# Patient Record
Sex: Male | Born: 1960 | Race: Black or African American | Hispanic: No | Marital: Single | Smoking: Never smoker
Health system: Southern US, Community
[De-identification: ages and names within clinical notes are randomized; demographics above are authoritative.]

## PROBLEM LIST (undated history)

## (undated) HISTORY — PX: KNEE SURGERY: SHX244

---

## 2006-09-29 ENCOUNTER — Emergency Department: Payer: Self-pay | Admitting: Emergency Medicine

## 2006-09-30 ENCOUNTER — Emergency Department: Payer: Self-pay | Admitting: Emergency Medicine

## 2009-03-24 ENCOUNTER — Ambulatory Visit: Payer: Self-pay | Admitting: Internal Medicine

## 2014-01-22 DIAGNOSIS — E785 Hyperlipidemia, unspecified: Secondary | ICD-10-CM | POA: Insufficient documentation

## 2017-07-20 ENCOUNTER — Ambulatory Visit (INDEPENDENT_AMBULATORY_CARE_PROVIDER_SITE_OTHER)
Admission: EM | Admit: 2017-07-20 | Discharge: 2017-07-20 | Disposition: A | Discharge status: Other Acute Inpatient Hospital | Payer: Commercial Managed Care - HMO | Source: Home / Self Care | Attending: Emergency Medicine | Admitting: Emergency Medicine

## 2017-07-20 ENCOUNTER — Encounter: Payer: Self-pay | Admitting: Emergency Medicine

## 2017-07-20 ENCOUNTER — Emergency Department: Payer: Commercial Managed Care - HMO

## 2017-07-20 ENCOUNTER — Emergency Department
Admission: EM | Admit: 2017-07-20 | Discharge: 2017-07-20 | Disposition: A | Discharge status: Home / Self Care | Payer: Commercial Managed Care - HMO | Attending: Emergency Medicine | Admitting: Emergency Medicine

## 2017-07-20 ENCOUNTER — Other Ambulatory Visit: Payer: Self-pay

## 2017-07-20 DIAGNOSIS — R1013 Epigastric pain: Secondary | ICD-10-CM | POA: Diagnosis not present

## 2017-07-20 DIAGNOSIS — R1110 Vomiting, unspecified: Secondary | ICD-10-CM | POA: Insufficient documentation

## 2017-07-20 DIAGNOSIS — D135 Benign neoplasm of extrahepatic bile ducts: Secondary | ICD-10-CM | POA: Diagnosis not present

## 2017-07-20 DIAGNOSIS — K819 Cholecystitis, unspecified: Secondary | ICD-10-CM | POA: Insufficient documentation

## 2017-07-20 LAB — URINALYSIS, COMPLETE (UACMP) WITH MICROSCOPIC
Bacteria, UA: NONE SEEN
Bilirubin Urine: NEGATIVE
GLUCOSE, UA: NEGATIVE mg/dL
Hgb urine dipstick: NEGATIVE
KETONES UR: NEGATIVE mg/dL
Nitrite: NEGATIVE
PH: 6 (ref 5.0–8.0)
PROTEIN: NEGATIVE mg/dL
Specific Gravity, Urine: 1.011 (ref 1.005–1.030)

## 2017-07-20 LAB — COMPREHENSIVE METABOLIC PANEL
ALBUMIN: 4 g/dL (ref 3.5–5.0)
ALK PHOS: 106 U/L (ref 38–126)
ALT: 41 U/L (ref 17–63)
AST: 55 U/L — AB (ref 15–41)
Anion gap: 7 (ref 5–15)
BUN: 13 mg/dL (ref 6–20)
CALCIUM: 8.7 mg/dL — AB (ref 8.9–10.3)
CHLORIDE: 105 mmol/L (ref 101–111)
CO2: 25 mmol/L (ref 22–32)
CREATININE: 0.9 mg/dL (ref 0.61–1.24)
GFR calc Af Amer: >60 mL/min (ref >60)
GFR calc non Af Amer: >60 mL/min (ref >60)
GLUCOSE: 97 mg/dL (ref 65–99)
Potassium: 4.6 mmol/L (ref 3.5–5.1)
SODIUM: 137 mmol/L (ref 135–145)
Total Bilirubin: 0.9 mg/dL (ref 0.3–1.2)
Total Protein: 6.7 g/dL (ref 6.5–8.1)

## 2017-07-20 LAB — CBC
HCT: 40.3 % (ref 40.0–52.0)
Hemoglobin: 13.3 g/dL (ref 13.0–18.0)
MCH: 31.1 pg (ref 26.0–34.0)
MCHC: 33 g/dL (ref 32.0–36.0)
MCV: 94.3 fL (ref 80.0–100.0)
PLATELETS: 216 10*3/uL (ref 150–440)
RBC: 4.27 MIL/uL — AB (ref 4.40–5.90)
RDW: 13.4 % (ref 11.5–14.5)
WBC: 5.5 10*3/uL (ref 3.8–10.6)

## 2017-07-20 LAB — LIPASE, BLOOD: LIPASE: 29 U/L (ref 11–51)

## 2017-07-20 LAB — TROPONIN I: Troponin I: <0.03 ng/mL (ref <0.03)

## 2017-07-20 MED ORDER — OXYCODONE-ACETAMINOPHEN 5-325 MG PO TABS
1.0000 | ORAL_TABLET | Freq: Once | ORAL | Status: AC
  Administered 2017-07-20: 1 via ORAL
  Filled 2017-07-20: qty 1

## 2017-07-20 MED ORDER — IOPAMIDOL (ISOVUE-300) INJECTION 61%
30.0000 mL | Freq: Once | INTRAVENOUS | Status: AC | PRN
  Administered 2017-07-20: 30 mL via ORAL

## 2017-07-20 MED ORDER — TRAMADOL HCL 50 MG PO TABS: 50.0000 mg | ORAL_TABLET | Freq: Four times a day (QID) | ORAL | 0 refills | Status: AC | PRN

## 2017-07-20 MED ORDER — IOPAMIDOL (ISOVUE-300) INJECTION 61%
100.0000 mL | Freq: Once | INTRAVENOUS | Status: AC | PRN
  Administered 2017-07-20: 100 mL via INTRAVENOUS

## 2017-07-20 MED ORDER — NAPROXEN 375 MG PO TABS: 375.0000 mg | ORAL_TABLET | Freq: Two times a day (BID) | ORAL | 0 refills | Status: AC

## 2017-07-20 MED ORDER — PROMETHAZINE HCL 12.5 MG PO TABS: 12.5000 mg | ORAL_TABLET | Freq: Four times a day (QID) | ORAL | 0 refills | Status: AC | PRN

## 2017-07-20 NOTE — ED Notes (Signed)
Patient transported to CT 

## 2017-07-20 NOTE — ED Provider Notes (Signed)
HPI  SUBJECTIVE:  Gerald Hicks is a 57 y.o. male who presents with hours long now has become constant non-migratory epigastric pain starting at 0300 this morning.  Patient states it radiates to his back.  He denies chest pain, shortness of breath, fevers, palpitations, chest pressure or heaviness.  No abdominal distention.  No dysuria, urinary urgency, frequency, cloudy odorous urine.  Patient does report penile discharge.  Denies testicular pain or swelling.  No alcohol use.  He tried drinking milk, water, vinegar which he promptly vomited up.  He states that this made the pain better.  He states the pain is worse with movement.  Brothers reports that the patient is not passing gas this morning.  He had a normal bowel movement yesterday.  Denies melena, hematochezia.  States the car ride over here was not painful.  Last meal was more than 6-8 hours ago, no history of pancreatitis, diabetes, hypertension, atrial fibrillation, gallbladder disease, mesenteric ischemia or abdominal surgeries.  Brother states that the patient has had similar pain before when the patient was found to be constipated.   History reviewed. No pertinent past medical history.  Past Surgical History:  Procedure Laterality Date  . KNEE SURGERY      Family History  Problem Relation Age of Onset  . Diabetes Mother   . Diabetes Father     Social History   Tobacco Use  . Smoking status: Never Smoker  . Smokeless tobacco: Never Used  Substance Use Topics  . Alcohol use: No    Frequency: Never  . Drug use: Not on file    No current facility-administered medications for this encounter.  No current outpatient medications on file.  No Known Allergies   ROS  As noted in HPI.   Physical Exam  BP 124/80 (BP Location: Left Arm)   Pulse 94   Temp 97.7 F (36.5 C)   Resp 18   Ht 5\' 4"  (1.626 m)   Wt 160 lb (72.6 kg)   SpO2 98%   BMI 27.46 kg/m   Constitutional: Well developed, well nourished, crying,   writhing around on the stretcher.  Diaphoretic. Eyes:  EOMI, conjunctiva normal bilaterally HENT: Normocephalic, atraumatic,mucus membranes moist Respiratory: Normal inspiratory effort Cardiovascular: Normal rate regular rhythm no murmurs or rubs, gallops GI: Limited exam due to patient discomfort.  Rigid abdomen. Normal appearance.  Positive subxiphoid tenderness.  No periumbilical tenderness.  No right lower quadrant, right upper quadrant tenderness.  Positive guarding.  Hypoactive bowel sounds. skin: No rash, skin intact Musculoskeletal: no deformities Neurologic: Alert & oriented x 3, no focal neuro deficits Psychiatric: Speech and behavior appropriate   ED Course   Medications - No data to display  Orders Placed This Encounter  Procedures  . ED EKG    Upper abd pain    Standing Status:   Standing    Number of Occurrences:   1    Order Specific Question:   Reason for Exam    Answer:   Other (See Comments)  . EKG 12-Lead    Standing Status:   Standing    Number of Occurrences:   1    No results found for this or any previous visit (from the past 24 hour(s)). No results found.  ED Clinical Impression  Epigastric pain   ED Assessment/Plan  Care everywhere records reviewed.  No mention of abdominal pain.  EKG: Normal sinus rhythm, rate 92.  Normal axis, normal intervals.  T wave inversion in 3, no  other ST-T wave changes.  T wave abnormality in lead III present in an EKG from 2010   due to patient's amount of pain, transferring to the ED via EMS for a comprehensive workup and pain control.  In the differential is perforated viscus, perforated ulcer, mesenteric ischemia, pancreatitis, aortic abdominal aneurysm, acute obstruction.   Discussed rationale for transfer with patient and brother.  They agree with plan.  Will notify the ED.  No orders of the defined types were placed in this encounter.   *This clinic note was created using Dragon dictation software.  Therefore, there may be occasional mistakes despite careful proofreading.   ?   Melynda Ripple, MD 07/20/17 1102

## 2017-07-20 NOTE — ED Provider Notes (Signed)
Patient received in sign-out from Dr. Cherylann Banas.  Workup and evaluation pending repeat abdominal exam monitoring.  Repeat abdominal exam soft and benign.  Patient's pain is controlled.  Remains afebrile.  At this point patient is appropriate for outpatient follow-up with GI.  Discussed strict return precautions.      Merlyn Lot, MD 07/20/17 [phone]

## 2017-07-20 NOTE — Discharge Instructions (Signed)

## 2017-07-20 NOTE — ED Triage Notes (Signed)
Patient states he woke at 3am with severe abdominal pain. Patient states he has had this issue before.

## 2017-07-20 NOTE — ED Triage Notes (Signed)
Pt to ED via EMS from [facility] c/o abd pain, epigastric, that started this morning around 3am.  States some nausea but no vomiting or diarrhea, last BM yesterday.  EMS gave 59mcg fentanyl, 4mg  zofran, and 262mL normal saline.

## 2017-07-20 NOTE — ED Provider Notes (Signed)
[facility] Emergency Department Provider Note ____________________________________________   First MD Initiated Contact with Patient 07/20/17 1202     (approximate)  I have reviewed the triage vital signs and the nursing notes.   HISTORY  Chief Complaint Abdominal Pain    HPI Gerald Hicks is a 57 y.o. male with no significant past medical history who presents with epigastric abdominal pain, acute onset approximately 9 hours ago during the night, episodic, with severe cramps lasting a few minutes and then resolving.  Patient reports one associated episode of vomiting although he states this was somewhat self-induced, and he last had a bowel movement yesterday which was normal.  He denies any unusual foods or recent antibiotics.  He denies any alcohol or drug use.  No prior history of this pain.   History reviewed. No pertinent past medical history.  There are no active problems to display for this patient.   Past Surgical History:  Procedure Laterality Date  . KNEE SURGERY      Prior to Admission medications   Not on File    Allergies Patient has no known allergies.  Family History  Problem Relation Age of Onset  . Diabetes Mother   . Diabetes Father     Social History Social History   Tobacco Use  . Smoking status: Never Smoker  . Smokeless tobacco: Never Used  Substance Use Topics  . Alcohol use: No    Frequency: Never  . Drug use: No    Review of Systems  Constitutional: No fever/chills. Eyes: No redness. ENT: No sore throat. Cardiovascular: Denies chest pain. Respiratory: Denies shortness of breath. Gastrointestinal: Positive for abdominal pain.  Genitourinary: Negative for dysuria.  Musculoskeletal: Negative for back pain. Skin: Negative for rash. Neurological: Negative for headache.   ____________________________________________   PHYSICAL EXAM:  VITAL SIGNS: ED Triage Vitals [07/20/17 1138]  Enc Vitals  Group     BP 129/77     Pulse Rate 85     Resp 18     Temp 97.8 F (36.6 C)     Temp Source Oral     SpO2 98 %     Weight 160 lb (72.6 kg)     Height 5\' 4"  (1.626 m)     Head Circumference      Peak Flow      Pain Score 5     Pain Loc      Pain Edu?      Excl. in [place]?     Constitutional: Alert and oriented. Well appearing and in no acute distress. Eyes: Conjunctivae are normal.  Head: Atraumatic. Nose: No congestion/rhinnorhea. Mouth/Throat: Mucous membranes are moist.   Neck: Normal range of motion.  Cardiovascular: Normal rate, regular rhythm. Grossly normal heart sounds.  Good peripheral circulation. Respiratory: Normal respiratory effort.  No retractions. Lungs CTAB. Gastrointestinal: Soft with mild epigastric discomfort. No distention.  Genitourinary: No CVA tenderness. Musculoskeletal: No lower extremity edema.  Extremities warm and well perfused.  Neurologic:  Normal speech and language. No gross focal neurologic deficits are appreciated.  Skin:  Skin is warm and dry. No rash noted. Psychiatric: Mood and affect are normal. Speech and behavior are normal.  ____________________________________________   LABS (all labs ordered are listed, but only abnormal results are displayed)  Labs Reviewed  COMPREHENSIVE METABOLIC PANEL - Abnormal; Notable for the following components:      Result Value   Calcium 8.7 (*)    AST 55 (*)    All  other components within normal limits  CBC - Abnormal; Notable for the following components:   RBC 4.27 (*)    All other components within normal limits  URINALYSIS, COMPLETE (UACMP) WITH MICROSCOPIC - Abnormal; Notable for the following components:   Color, Urine YELLOW (*)    APPearance CLEAR (*)    Leukocytes, UA SMALL (*)    Squamous Epithelial / LPF 0-5 (*)    All other components within normal limits  LIPASE, BLOOD  TROPONIN I   ____________________________________________  EKG  ED ECG REPORT I, Arta Silence, the  attending physician, personally viewed and interpreted this ECG.  Date: 07/20/2017 EKG Time: 1143 Rate: 69 Rhythm: normal sinus rhythm QRS Axis: normal Intervals: normal ST/T Wave abnormalities: normal Narrative Interpretation: no evidence of acute ischemia  ____________________________________________  RADIOLOGY  CT abd: Gallbladder wall thickening [place] RUQ: Gallbladder wall thickening, no stones or pericholecystic fluid  ____________________________________________   PROCEDURES  Procedure(s) performed: No  Procedures  Critical Care performed: No ____________________________________________   INITIAL IMPRESSION / ASSESSMENT AND PLAN / ED COURSE  Pertinent labs & imaging results that were available during my care of the patient were reviewed by me and considered in my medical decision making (see chart for details).  57 year old male presents with intermittent epigastric abdominal pain over the last several hours, associated with one episode of vomiting.  Patient reports severe pain that had him doubled over during the episodes.  He was seen in urgent care, and referred to the emergency department for further evaluation due to the severity of the pain.  Past medical records reviewed in Epic and are noncontributory.  On exam, the vital signs are normal, the patient is now relatively well-appearing, and during my exam, the abdomen is soft and nontender.  Differential includes gastritis, PUD, pancreatitis, other hepatobiliary cause, or less likely colitis or diverticulitis.  Plan: Labs, analgesia, CT abdomen, and reassess.   ----------------------------------------- 4:00 PM on 07/20/2017 -----------------------------------------  Ultrasound shows findings more consistent with adenomyomatosis of the gallbladder and not acute cholecystitis.  I consulted Dr. Burt Knack from surgery who confirmed that based on the clinical presentation, lab workup, and imaging findings there is no  indication for acute surgical intervention.  He recommended the patient follow-up with GI first.  On reassessment, the patient reported recurring pain.  I ordered p.o. analgesia, and I also consulted Dr. Vicente Males from GI.  He agreed that the patient foot should follow-up with GI, and also advised that if the patient had persistent pain and required admission that GI would be able to consult.  Based on discussion with the patient will observe after the additional pain medication and reassess.  If patient has  persistent or worsening pain, we will admit with GI consulting, and if his symptoms resolve, we will discharge home with outpatient GI follow-up.  The patient agrees with the plan.  I signed the patient out to the oncoming physician Dr. Quentin Cornwall.  ____________________________________________   FINAL CLINICAL IMPRESSION(S) / ED DIAGNOSES  Final diagnoses:  Cholecystitis  Adenomyomatosis of gallbladder  Epigastric pain      NEW MEDICATIONS STARTED DURING THIS VISIT:  New Prescriptions   No medications on file     Note:  This document was prepared using Dragon voice recognition software and may include unintentional dictation errors.    Arta Silence, MD 07/20/17 [phone]

## 2017-07-24 ENCOUNTER — Ambulatory Visit (INDEPENDENT_AMBULATORY_CARE_PROVIDER_SITE_OTHER): Payer: 59 | Admitting: Gastroenterology

## 2017-07-24 ENCOUNTER — Encounter: Payer: Self-pay | Admitting: Gastroenterology

## 2017-07-24 VITALS — BP 121/78 | HR 79 | Temp 98.5°F | Ht 62.0 in | Wt 139.2 lb

## 2017-07-24 DIAGNOSIS — R1013 Epigastric pain: Secondary | ICD-10-CM

## 2017-07-24 MED ORDER — OMEPRAZOLE 40 MG PO CPDR: 40.0000 mg | DELAYED_RELEASE_CAPSULE | Freq: Every day | ORAL | 3 refills | Status: AC

## 2017-07-24 NOTE — Addendum Note (Signed)
Addended by: Peggye Ley on: 07/24/2017 03:29 PM   Modules accepted: Orders

## 2017-07-24 NOTE — Progress Notes (Signed)
Jonathon Bellows MD, MRCP(U.K) [address]  [place]  [place], [place] [address]  Main: [phone]  Fax: [phone]   Gastroenterology Consultation  Referring Provider:    ER Primary Care Physician:  Patient, No Pcp Per Primary Gastroenterologist:  Dr. Jonathon Bellows  Reason for Consultation:     Abdominal pain         HPI:   Gerald Hicks is a 57 y.o. y/o male he presented to the emergency room on 07/20/2017 with abdominal pain which began 9 hours prior to the presentation at the ER.  One episode of vomiting and normal bowel movements.  He underwent a CT scan of the abdomen which showed hyperenhancing gallbladder wall mildly thickened possible cholecystitis.  Subsequently underwent an ultrasound of right upper quadrant which demonstrated adenomyomatosis of the gallbladder cholecystitis cannot be excluded, no gallstones or biliary dilation noted.  He he underwent lab work in the emergency room with normal troponins, normal urine analysis, hemoglobin 13.3 g and liver function tests are normal transaminases except an AST mildly elevated at 25 and normal lipase.  He says the pain lasted about 12 hours , the pain resolved. Presently has pain in his epigastrium after meals, usually 15 minutes after and lasta few seconds. When he first started it used to be severe but not as severe at this time. Gained 6 lbs since 08/2016 . He has had a colonoscopy and says he is not due at this time.   No gall bladder problems in the family. Denies use of any NSAID's. He has a bowel movement , feels better after a bowel movement.      No past medical history on file.  Past Surgical History:  Procedure Laterality Date  . KNEE SURGERY      Prior to Admission medications   Medication Sig Start Date End Date Taking? Authorizing Provider  naproxen (NAPROSYN) 375 MG tablet Take 1 tablet (375 mg total) by mouth 2 (two) times daily with a meal for 10 days. 07/20/17 07/30/17  Merlyn Lot, MD  promethazine  (PHENERGAN) 12.5 MG tablet Take 1 tablet (12.5 mg total) by mouth every 6 (six) hours as needed for nausea or vomiting. 07/20/17   Merlyn Lot, MD  traMADol (ULTRAM) 50 MG tablet Take 1 tablet (50 mg total) by mouth every 6 (six) hours as needed. 07/20/17 07/20/18  Merlyn Lot, MD    Family History  Problem Relation Age of Onset  . Diabetes Mother   . Diabetes Father      Social History   Tobacco Use  . Smoking status: Never Smoker  . Smokeless tobacco: Never Used  Substance Use Topics  . Alcohol use: No    Frequency: Never  . Drug use: No    Allergies as of 07/24/2017  . (No Known Allergies)    Review of Systems:    All systems reviewed and negative except where noted in HPI.   Physical Exam:  BP 121/78   Pulse 79   Temp 98.5 F (36.9 C) (Oral)   Ht 5\' 2"  (1.575 m)   Wt 141 lb 9.6 oz (64.2 kg)   BMI 25.90 kg/m  No LMP for male patient. Psych:  Alert and cooperative. Normal mood and affect. General:   Alert,  Well-developed, well-nourished, pleasant and cooperative in NAD Head:  Normocephalic and atraumatic. Eyes:  Sclera clear, no icterus.   Conjunctiva pink. Ears:  Normal auditory acuity. Nose:  No deformity, discharge, or lesions. Mouth:  No deformity or  lesions,oropharynx pink & moist. Neck:  Supple; no masses or thyromegaly. Lungs:  Respirations even and unlabored.  Clear throughout to auscultation.   No wheezes, crackles, or rhonchi. No acute distress. Heart:  Regular rate and rhythm; no murmurs, clicks, rubs, or gallops. Abdomen:  Normal bowel sounds.  No bruits.  Soft, mild epigastric tenderness and non-distended without masses, hepatosplenomegaly or hernias noted.  No guarding or rebound tenderness.    Neurologic:  Alert and oriented x3;  grossly normal neurologically. Skin:  Intact without significant lesions or rashes. No jaundice. Lymph Nodes:  No significant cervical adenopathy. Psych:  Alert and cooperative. Normal mood and affect.  Imaging  Studies: Ct Abdomen Pelvis W Contrast  Result Date: 07/20/2017 CLINICAL DATA:  Epigastric pain acute EXAM: CT ABDOMEN AND PELVIS WITH CONTRAST TECHNIQUE: Multidetector CT imaging of the abdomen and pelvis was performed using the standard protocol following bolus administration of intravenous contrast. CONTRAST:  163mL ISOVUE-300 IOPAMIDOL (ISOVUE-300) INJECTION 61% COMPARISON:  None. FINDINGS: Lower chest: Negative Hepatobiliary: Normal liver. Partially contracted gallbladder. Gallbladder wall shows mild thickening and hyperenhancement. No gallstones identified. No biliary dilatation. Pancreas: Negative Spleen: Negative Adrenals/Urinary Tract: Normal kidneys. No renal mass or obstruction. No urinary tract calculi. Distended urinary bladder without thickening. Stomach/Bowel: Normal stomach. Negative for bowel obstruction. Negative for bowel mass or edema. Normal appendix. Negative for high diverticulitis. Vascular/Lymphatic: Negative Reproductive: Mild prostate enlargement. Other: No free fluid.  Negative for hernia Musculoskeletal: Negative IMPRESSION: Hyperenhancing gallbladder wall which is mildly thickened. Possible cholecystitis. Gallbladder ultrasound recommended for further evaluation. Otherwise no acute abnormality.  Normal appendix. Electronically Signed   By: Franchot Gallo M.D.   On: 07/20/2017 14:21   US Abdomen Limited Ruq  Result Date: 07/20/2017 CLINICAL DATA:  Cholecystitis.  Epigastric pain. EXAM: ULTRASOUND ABDOMEN LIMITED RIGHT UPPER QUADRANT COMPARISON:  CT 07/20/2017. FINDINGS: Gallbladder: Small nodular densities noted in the gallbladder wall. Ring down noted. Gallbladder wall is thickened at 4.3 mm. These findings suggest adenomyomatosis. Cholecystitis cannot be excluded. No gallstones noted. Common bile duct: Diameter: 3.0 mm Liver: No focal lesion identified. Within normal limits in parenchymal echogenicity. Portal vein is patent on color Doppler imaging with normal direction of blood  flow towards the liver. IMPRESSION: 1. Findings most consistent with adenomyomatosis of the gallbladder. Cholecystitis cannot be excluded. No gallstones or biliary distention noted. 2.  Exam otherwise unremarkable. Electronically Signed   By: Marcello Moores  Register   On: 07/20/2017 15:27    Assessment and Plan:   Gerald Hicks is a 57 y.o. y/o male has been referred for abdominal pain. Patient is hard to elicit a clear history . Some elements in his pain which could suggest biliary in nature, similar picture could also be seen in gastritis. RUQ USG showed adenomyomatosis of the gall bladder. Very mild tenderness at the epigastrium , overall he says he feels better since ER visit  Plan  1. Trial of PPI  2. I will refer to Dr Dahlia Byes to obtain his assesment of the pain to see if a cholecystectomy is warranted.  3. I will see him back in 4 weeks to see if the PPI has made any difference.   Follow up in 4 weeks   Dr Jonathon Bellows MD,MRCP(U.K)

## 2017-07-27 ENCOUNTER — Encounter: Payer: Self-pay | Admitting: General Practice

## 2017-08-21 ENCOUNTER — Encounter: Payer: Self-pay | Admitting: Gastroenterology

## 2017-08-21 ENCOUNTER — Ambulatory Visit: Payer: 59 | Admitting: Gastroenterology

## 2018-11-14 ENCOUNTER — Other Ambulatory Visit: Payer: 59

## 2019-01-28 IMAGING — US US ABDOMEN LIMITED
1 series · 14 of 25 positions shown · non-contrast
Comparison: CT 07/20/2017.

CLINICAL DATA: Cholecystitis.  Epigastric pain.

EXAM:
ULTRASOUND ABDOMEN LIMITED RIGHT UPPER QUADRANT

[Series 1: us abdomen limited · 0.20mm/px · 14 of 45 slices shown]
[im 1/45]
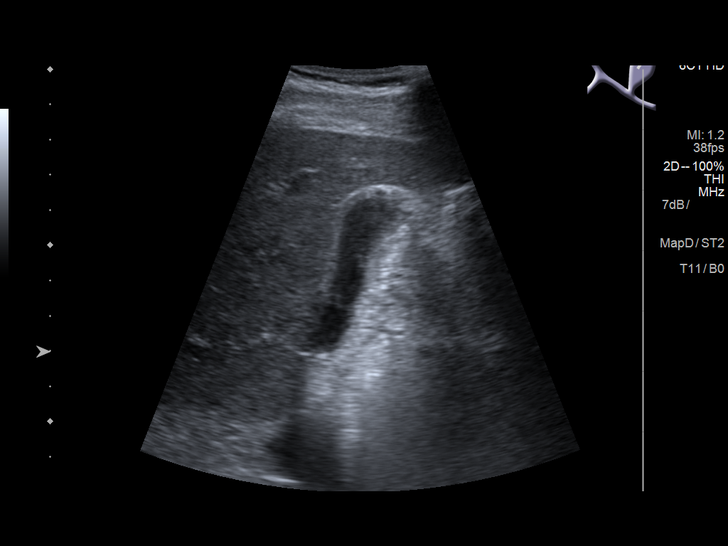
[im 4/45]
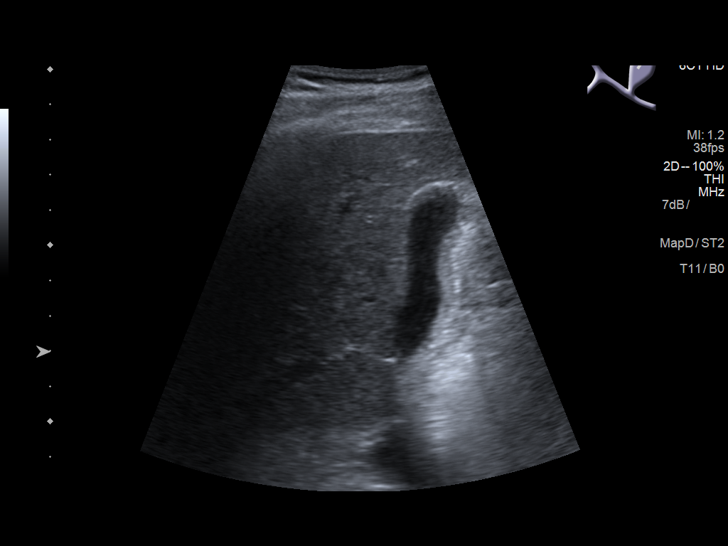
[im 8/45]
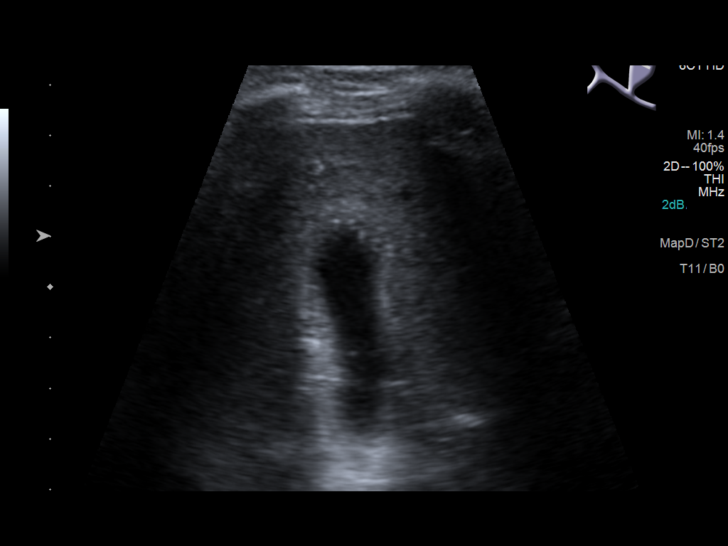
[im 12/45]
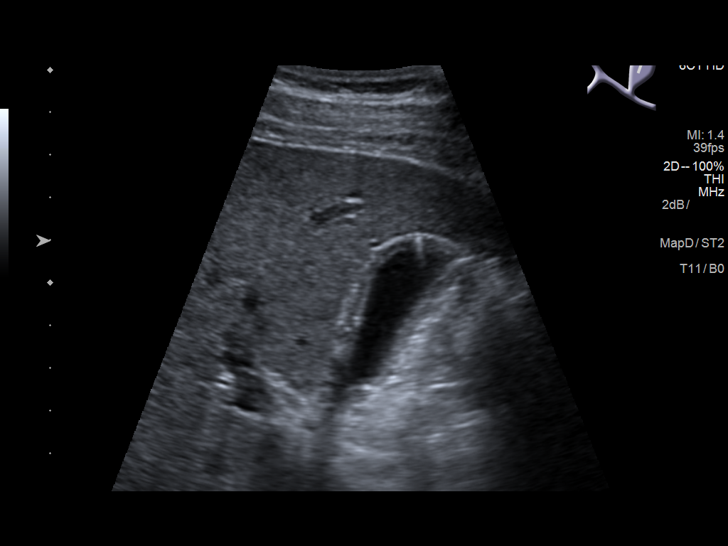
[im 15/45]
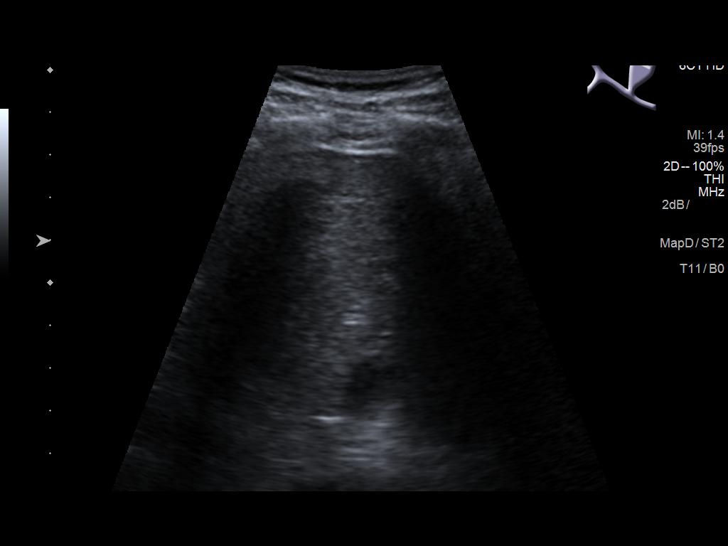
[im 17/45]
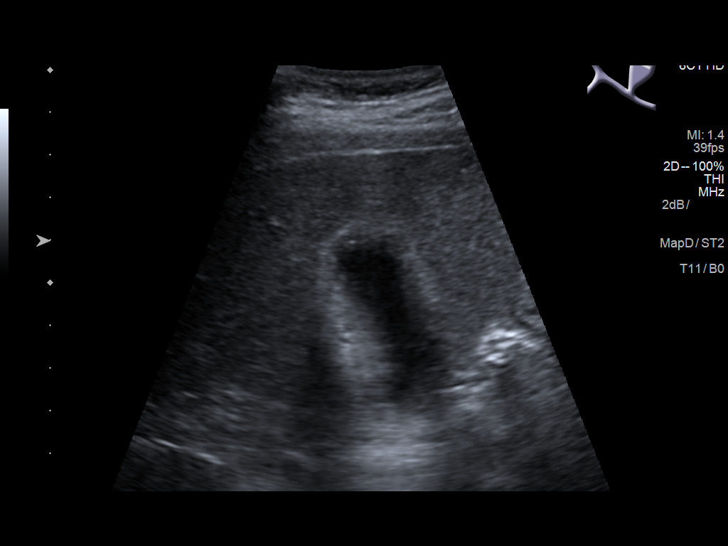
[im 21/45]
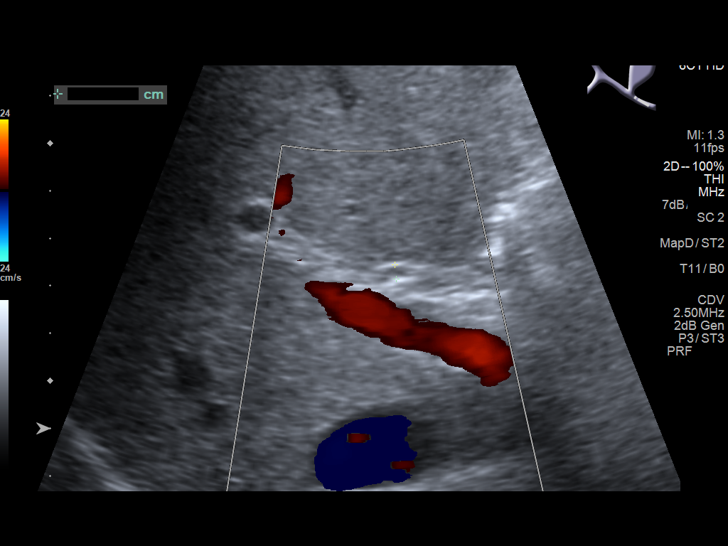
[im 24/45]
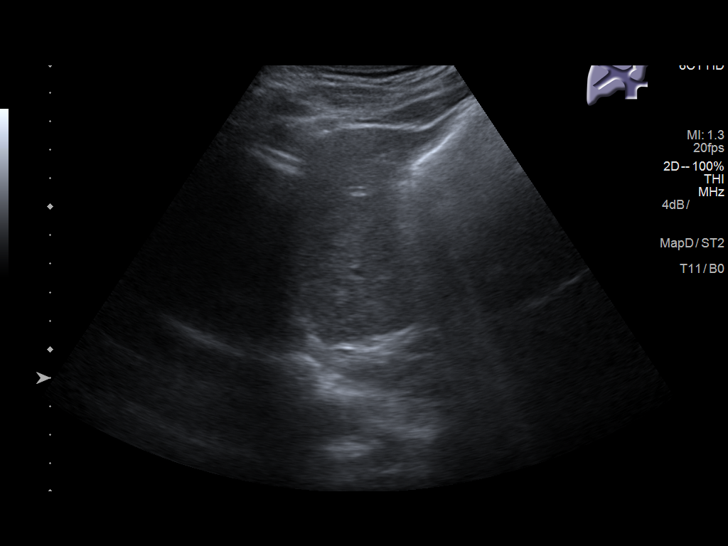
[im 28/45]
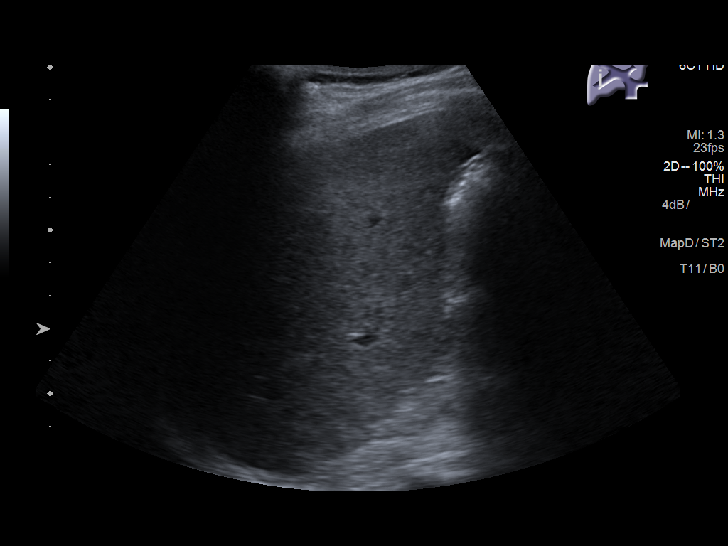
[im 30/45]
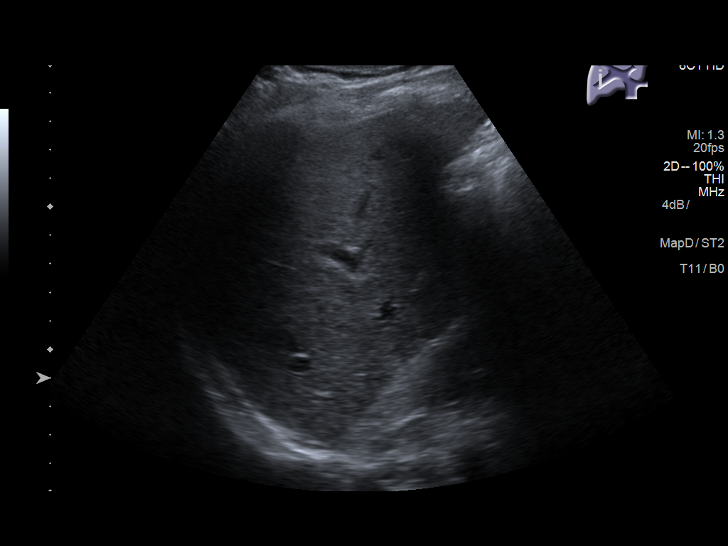
[im 34/45]
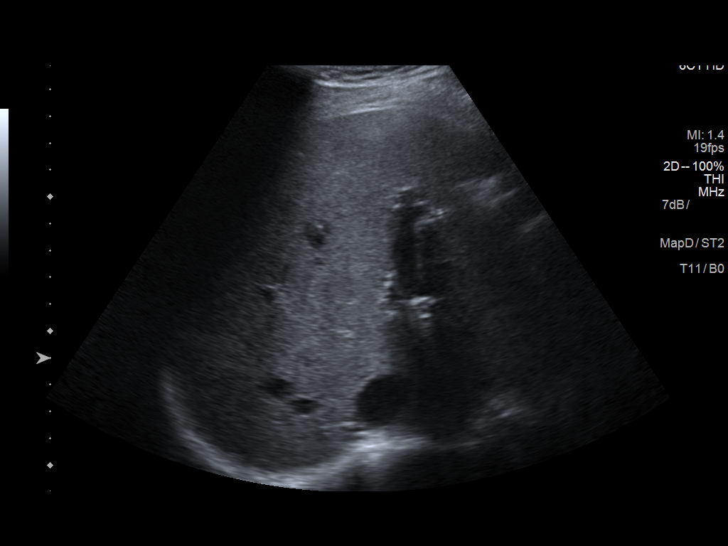
[im 37/45]
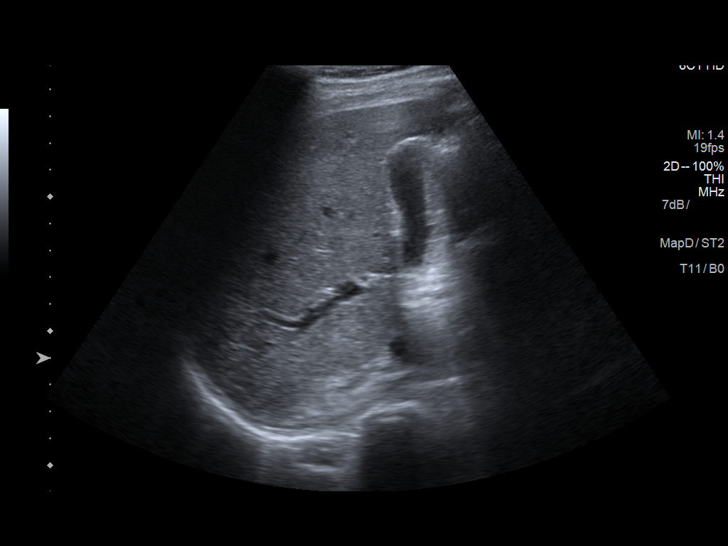
[im 41/45]
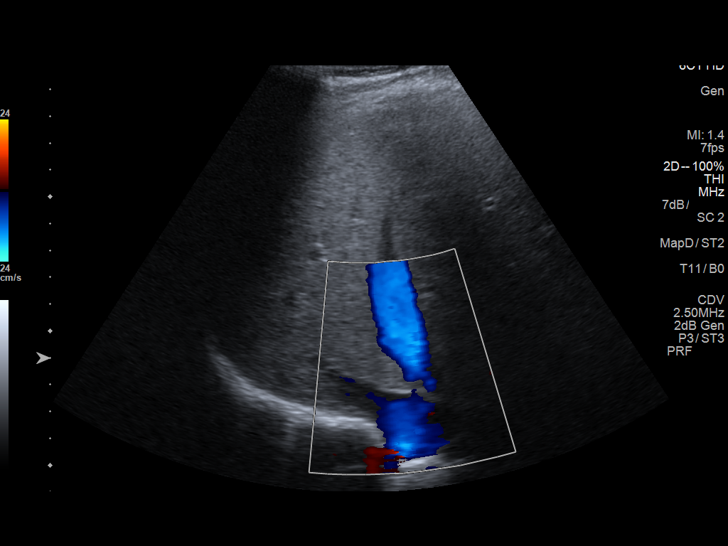
[im 45/45]
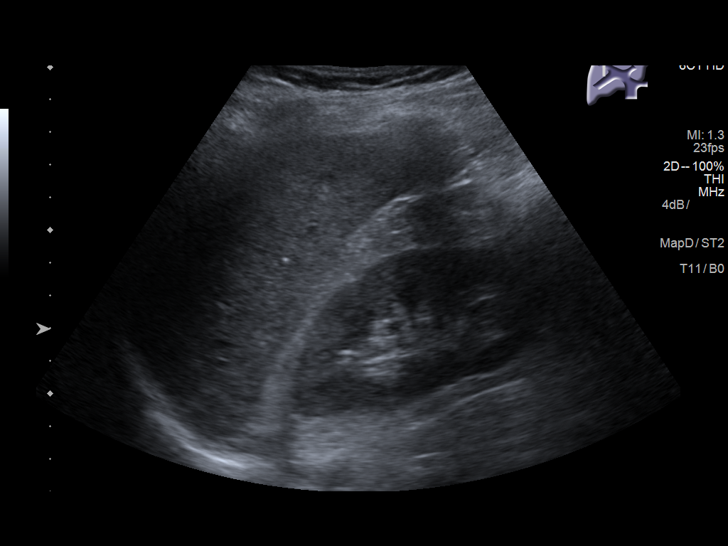

[14 of 25 positions shown; findings below may reference images not displayed]

FINDINGS: Gallbladder:

Small nodular densities noted in the gallbladder wall. Ring down
noted. Gallbladder wall is thickened at 4.3 mm. These findings
suggest adenomyomatosis. Cholecystitis cannot be excluded. No
gallstones noted.

Common bile duct:

Diameter: 3.0 mm

Liver:

No focal lesion identified. Within normal limits in parenchymal
echogenicity. Portal vein is patent on color Doppler imaging with
normal direction of blood flow towards the liver.
IMPRESSION: 1. Findings most consistent with adenomyomatosis of the gallbladder.
Cholecystitis cannot be excluded. No gallstones or biliary
distention noted.

2.  Exam otherwise unremarkable.

## 2019-11-10 IMAGING — CT CT ABD-PELV W/ CM
2 of 5 series · 16 of 46 positions shown, 18 images · IV contrast (iopamidol)
Comparison: None.

CLINICAL DATA: Epigastric pain acute

EXAM:
CT ABDOMEN AND PELVIS WITH CONTRAST
TECHNIQUE: Multidetector CT imaging of the abdomen and pelvis was performed
using the standard protocol following bolus administration of
intravenous contrast.
CONTRAST:  100mL Z23D4M-SXX IOPAMIDOL (Z23D4M-SXX) INJECTION 61%

[Series 2: routine abd/pel with · axial · 0.66mm/px · z∈[-615,-225]mm · 13 of 88 slices shown, 15 images]
[im 5/88  soft-tissue]
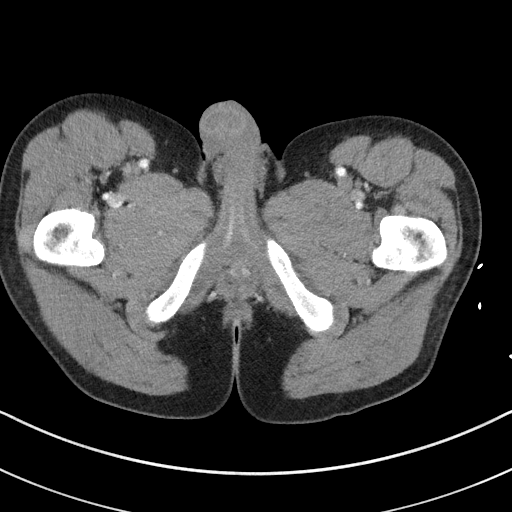
[im 5/88  bone]
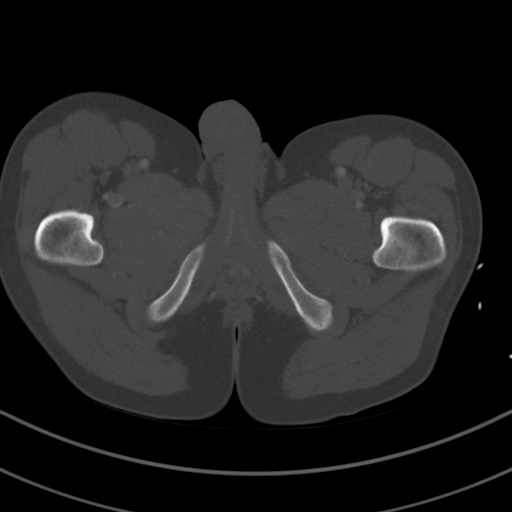
[im 14/88  soft-tissue]
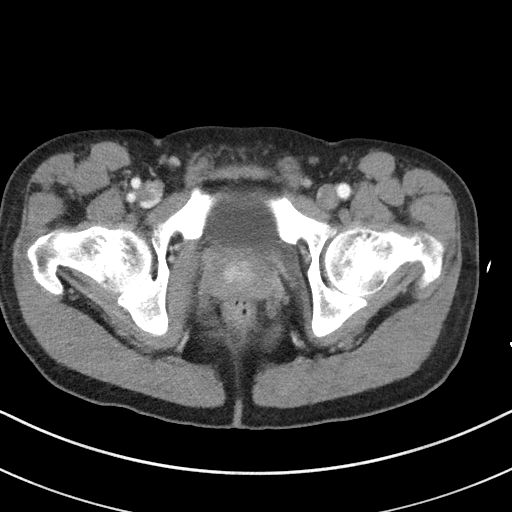
[im 19/88  soft-tissue]
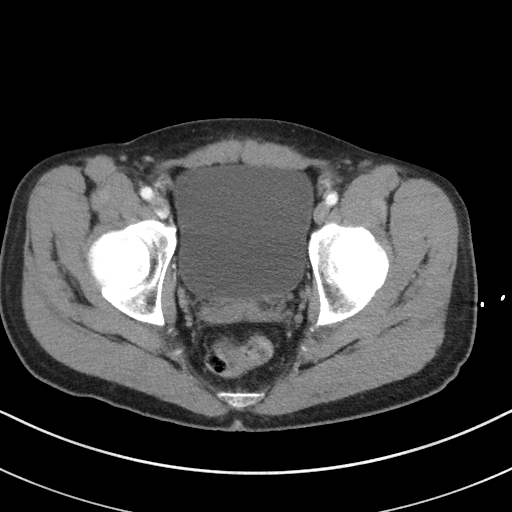
[im 23/88  soft-tissue]
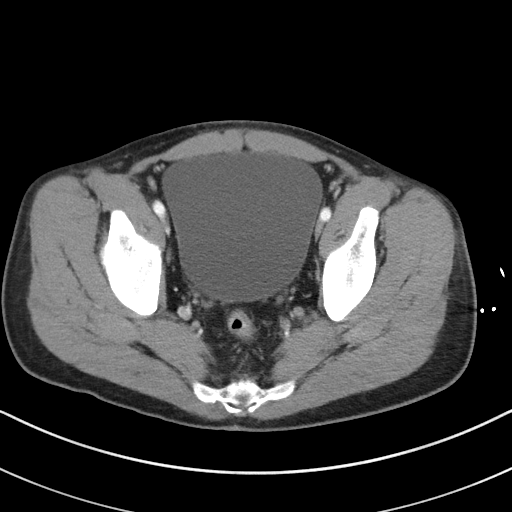
[im 33/88  soft-tissue]
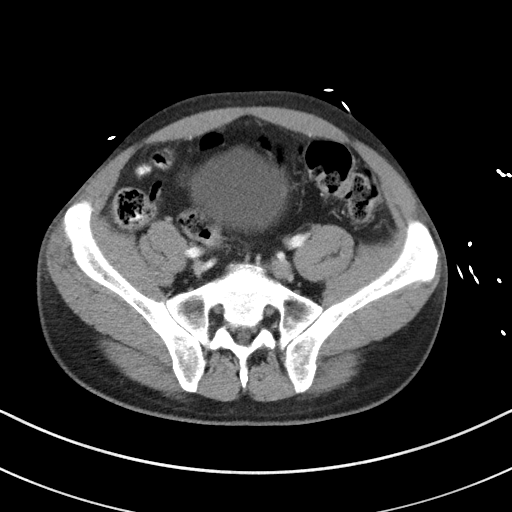
[im 37/88  soft-tissue]
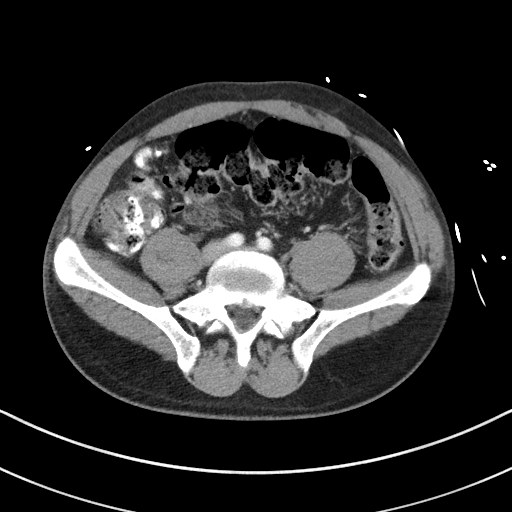
[im 46/88  soft-tissue]
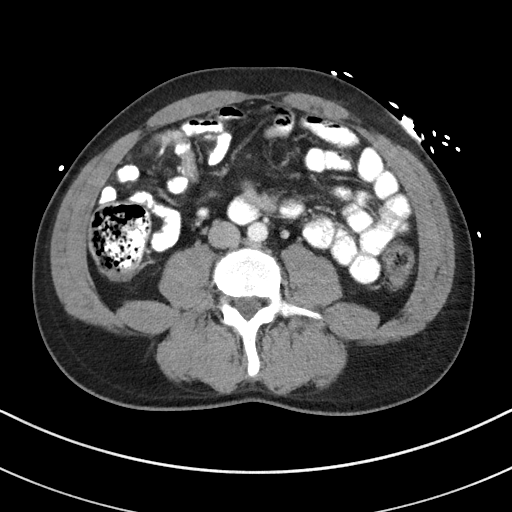
[im 51/88  soft-tissue]
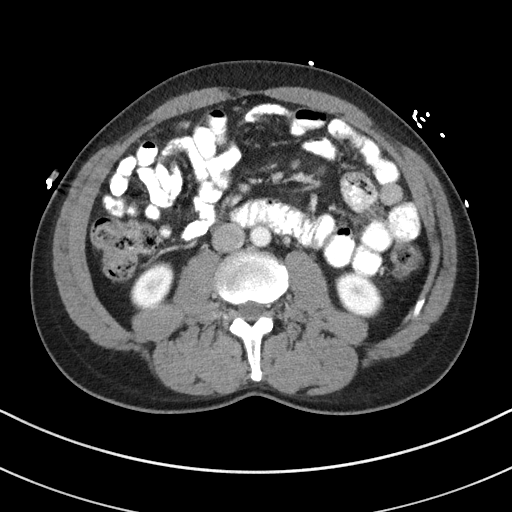
[im 55/88  soft-tissue]
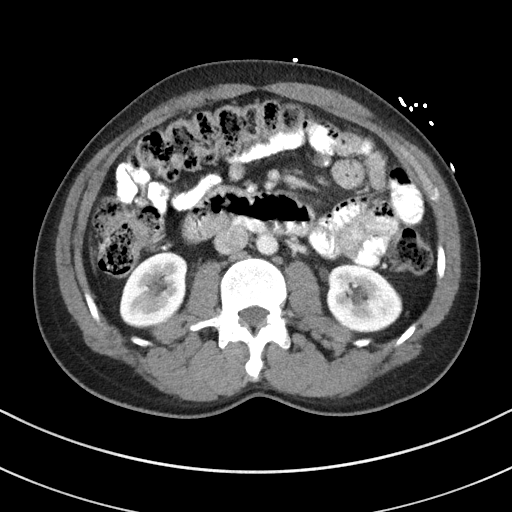
[im 55/88  bone]
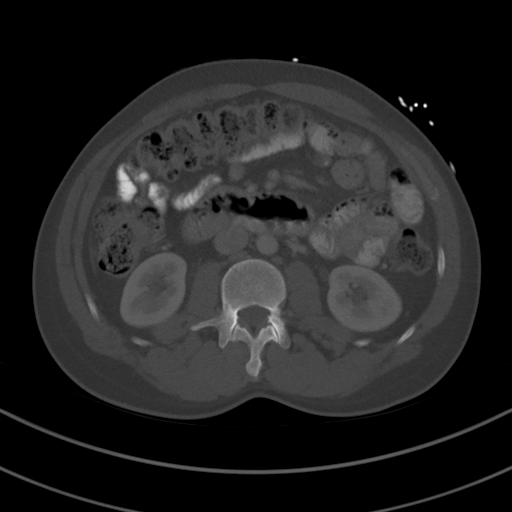
[im 65/88  soft-tissue]
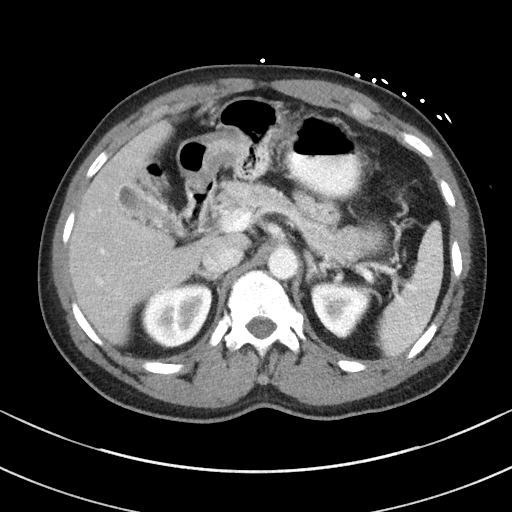
[im 69/88  soft-tissue]
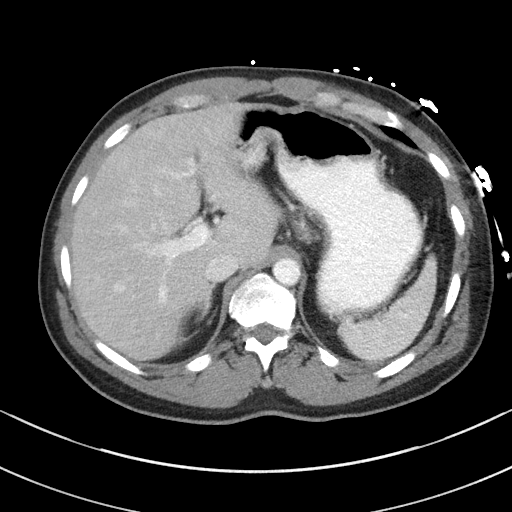
[im 74/88  soft-tissue]
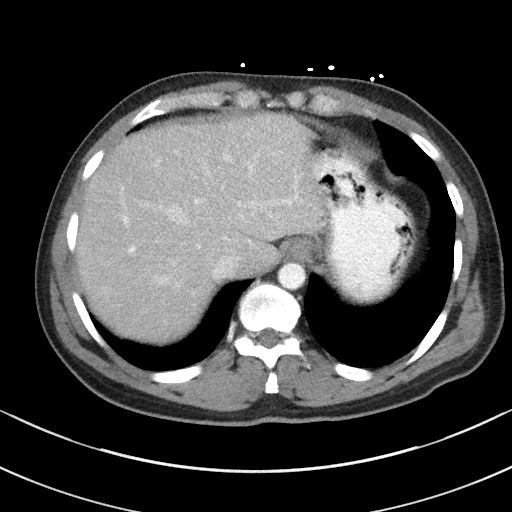
[im 83/88  soft-tissue]
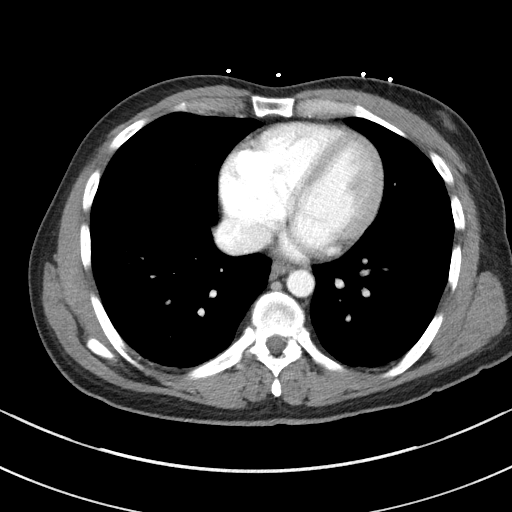

[Series 5: coronal st · coronal · 0.62mm/px · 3 of 77 slices shown]
[im 26/77  soft-tissue]
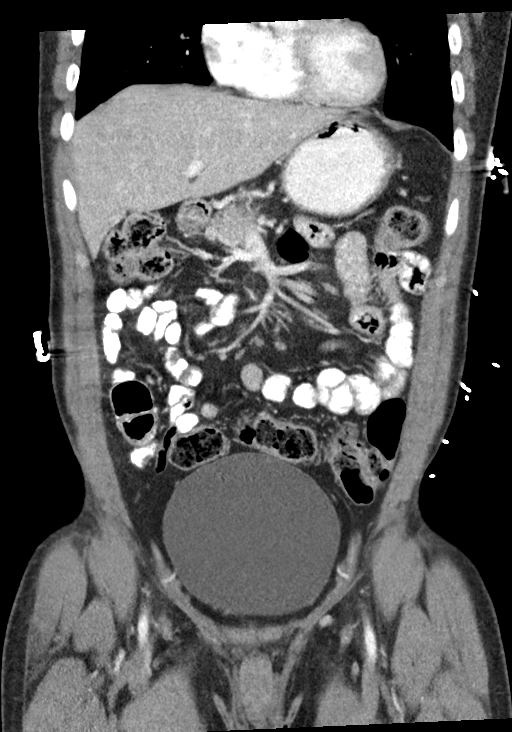
[im 34/77  soft-tissue]
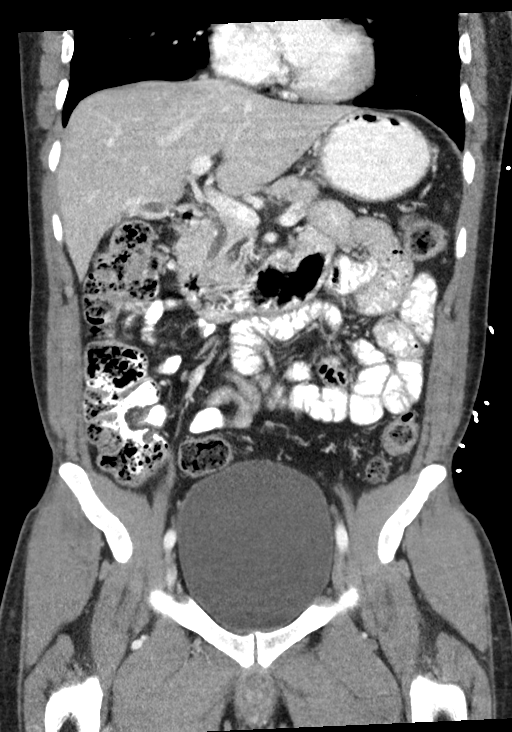
[im 43/77  soft-tissue]
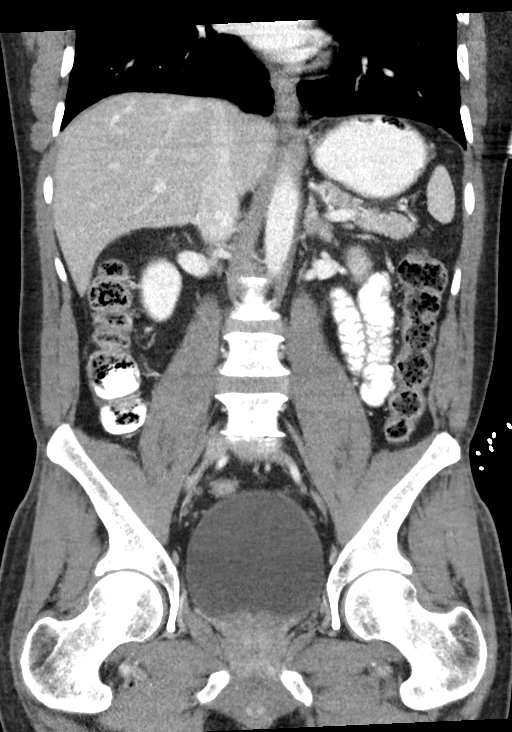

[16 of 46 positions shown; findings below may reference images not displayed]

FINDINGS: Lower chest: Negative

Hepatobiliary: Normal liver. Partially contracted gallbladder.
Gallbladder wall shows mild thickening and hyperenhancement. No
gallstones identified. No biliary dilatation.

Pancreas: Negative

Spleen: Negative

Adrenals/Urinary Tract: Normal kidneys. No renal mass or
obstruction. No urinary tract calculi. Distended urinary bladder
without thickening.

Stomach/Bowel: Normal stomach. Negative for bowel obstruction.
Negative for bowel mass or edema. Normal appendix. Negative for high
diverticulitis.

Vascular/Lymphatic: Negative

Reproductive: Mild prostate enlargement.

Other: No free fluid.  Negative for hernia

Musculoskeletal: Negative
IMPRESSION: Hyperenhancing gallbladder wall which is mildly thickened. Possible
cholecystitis. Gallbladder ultrasound recommended for further
evaluation.

Otherwise no acute abnormality.  Normal appendix.
